# Patient Record
Sex: Male | Born: 1960 | Race: White | Hispanic: No | Marital: Single | Smoking: Never smoker
Health system: Southern US, Community
[De-identification: ages and names within clinical notes are randomized; demographics above are authoritative.]

## PROBLEM LIST (undated history)

## (undated) DIAGNOSIS — E119 Type 2 diabetes mellitus without complications: Secondary | ICD-10-CM

## (undated) HISTORY — DX: Type 2 diabetes mellitus without complications: E11.9

## (undated) HISTORY — PX: APPENDECTOMY: SHX54

---

## 2005-07-22 ENCOUNTER — Emergency Department (HOSPITAL_COMMUNITY): Admission: EM | Admit: 2005-07-22 | Discharge: 2005-07-22 | Payer: Self-pay | Admitting: Emergency Medicine

## 2012-08-13 ENCOUNTER — Ambulatory Visit (INDEPENDENT_AMBULATORY_CARE_PROVIDER_SITE_OTHER): Payer: BC Managed Care – PPO | Admitting: Family Medicine

## 2012-08-13 VITALS — BP 122/80 | HR 99 | Temp 98.1°F | Resp 16 | Ht 68.0 in | Wt 219.0 lb

## 2012-08-13 DIAGNOSIS — C9202 Acute myeloblastic leukemia, in relapse: Secondary | ICD-10-CM

## 2012-08-13 DIAGNOSIS — Z23 Encounter for immunization: Secondary | ICD-10-CM

## 2012-08-13 DIAGNOSIS — IMO0001 Reserved for inherently not codable concepts without codable children: Secondary | ICD-10-CM

## 2012-08-13 LAB — POCT CBC
Granulocyte percent: 70.2 %G (ref 37–80)
HCT, POC: 57 % — AB (ref 43.5–53.7)
MCHC: 33.7 g/dL (ref 31.8–35.4)
MCV: 93 fL (ref 80–97)
MID (cbc): 0.5 (ref 0–0.9)
MPV: 9.6 fL (ref 0–99.8)
POC Granulocyte: 5 (ref 2–6.9)
POC LYMPH PERCENT: 23.4 %L (ref 10–50)
POC MID %: 6.4 %M (ref 0–12)
Platelet Count, POC: 207 10*3/uL (ref 142–424)

## 2012-08-13 LAB — BASIC METABOLIC PANEL
CO2: 29 mEq/L (ref 19–32)
Calcium: 9.4 mg/dL (ref 8.4–10.5)
Creat: 0.81 mg/dL (ref 0.50–1.35)
Sodium: 134 mEq/L — ABNORMAL LOW (ref 135–145)

## 2012-08-13 LAB — GLUCOSE, POCT (MANUAL RESULT ENTRY): POC Glucose: 369 mg/dl — AB (ref 70–99)

## 2012-08-13 MED ORDER — METFORMIN HCL 500 MG PO TABS: ORAL_TABLET | ORAL | Status: DC

## 2012-08-13 NOTE — Patient Instructions (Addendum)
You have "type II" or adult onset diabetes mellitus.  This means that your body does not use up sugar in your blood, causing your blood sugar level to get too high.  Over time this can damage your blood vessels, kidneys, eyes and heart- making it important to treat diabetes carefully.    Please start the metfomin medication- 1 pill twice a day.  Over the course of about 3 weeks try to increase to 2 pills, or 1000 mg, of metformin twice a day.    If you do not already see an eye doctor, please start seeing one yearly  It is important that you have a flu shot every flu season  I will be in touch with you regarding the rest of your labs.  Please come and see [place] in about 2 weeks to check your progress.  Alternatively, you can establish with the primary care doctor of your choice.    Stop drinking regular sodas, and cut down on sugar and bread/ pasta/ rice.  Eat more vegetables and lean protein, and low- fat dairy products.  Look over the american diabetes association website for lots of good information.

## 2012-08-13 NOTE — Progress Notes (Signed)
[facility] [address], [place] [address] [phone]  Date:  08/13/2012   Name:  Zachary Carr   DOB:  [birth year]   MRN:  [id number]  PCP:  No primary provider on file.    Chief Complaint: lab work   History of Present Illness:  Zachary Carr is a 52 y.o. very pleasant male patient who presents with the following:  Here today to evaluate some labs that he had done last month for life insurance.  He does not see a doctor regularly, does not have a PCP and has not had any care in some time. His labs showed an A1c of 14.7, and a FBG of 273.  He has not eaten so far today, and was also fasting at the time of his life insurance blood work.  Suprisingly, his cholesterol was excellent with and LDL in the 50s, and PSA was normal.    He is generally healthy except for this new dx of diabetes.  He is slightly over weight  He works as a Visual merchandiser  There is no problem list on file for this patient.   No past medical history on file.  No past surgical history on file.  History  Substance Use Topics  . Smoking status: Never Smoker   . Smokeless tobacco: Not on file  . Alcohol Use: Not on file    No family history on file.  Not on File  Medication list has been reviewed and updated.  No current outpatient prescriptions on file prior to visit.   No current facility-administered medications on file prior to visit.    Review of Systems:  As per HPI- otherwise negative.  No family history of DM that he is aware of.   Physical Examination: Filed Vitals:   08/13/12 0918  BP: 122/80  Pulse: 99  Temp: 98.1 F (36.7 C)  Resp: 16   Filed Vitals:   08/13/12 0918  Height: 5\' 8"  (1.727 m)  Weight: 219 lb (99.338 kg)   Body mass index is 33.31 kg/(m^2). Ideal Body Weight: Weight in (lb) to have BMI = 25: 164.1  GEN: WDWN, NAD, Non-toxic, A & O x 3 HEENT: Atraumatic, Normocephalic. Neck supple. No masses, No LAD. Ears and Nose: No external deformity. CV:  RRR, No M/G/R. No JVD. No thrill. No extra heart sounds. PULM: CTA B, no wheezes, crackles, rhonchi. No retractions. No resp. distress. No accessory muscle use. ABD: S, NT, ND, +BS. No rebound. No HSM. EXTR: No c/c/e NEURO Normal gait.  PSYCH: Normally interactive. Conversant. Not depressed or anxious appearing.  Calm demeanor.    Results for orders placed in visit on 08/13/12  GLUCOSE, POCT (MANUAL RESULT ENTRY)      Result Value Range   POC Glucose 369 (*) 70 - 99 mg/dl  POCT GLYCOSYLATED HEMOGLOBIN (HGB A1C)      Result Value Range   Hemoglobin A1C >14.0      Assessment and Plan: Type II or unspecified type diabetes mellitus without mention of complication, uncontrolled - Plan: POCT glucose (manual entry), POCT glycosylated hemoglobin (Hb A1C), metFORMIN (GLUCOPHAGE) 500 MG tablet, Pneumococcal polysaccharide vaccine 23-valent greater than or equal to 2yo subcutaneous/IM, Basic metabolic panel, POCT CBC  New dx of DM today.  Will start metfomin 500 BID and plan to build to 1000 mg BID over a few weeks.  Await BMP, and will follow- up with results.  Paper rx for glucose meter and supplies.    See patient  instructions for more details.     Signed Lamar Blinks, MD

## 2012-08-14 ENCOUNTER — Telehealth: Payer: Self-pay | Admitting: Family Medicine

## 2012-08-14 ENCOUNTER — Encounter: Payer: Self-pay | Admitting: Family Medicine

## 2012-08-14 NOTE — Telephone Encounter (Signed)
Called- metabolic profile looks ok, but I am concerned about his elevated HCT/ HG.  May be due to dehydration.  Drink plenty of water and start baby asa daily.  Come and see me in 2 weeks for a recheck.  He agreed

## 2012-08-31 ENCOUNTER — Ambulatory Visit (INDEPENDENT_AMBULATORY_CARE_PROVIDER_SITE_OTHER): Payer: BC Managed Care – PPO | Admitting: Family Medicine

## 2012-08-31 VITALS — BP 124/80 | HR 95 | Temp 99.1°F | Resp 18 | Ht 68.0 in | Wt 218.0 lb

## 2012-08-31 DIAGNOSIS — D45 Polycythemia vera: Secondary | ICD-10-CM

## 2012-08-31 DIAGNOSIS — IMO0001 Reserved for inherently not codable concepts without codable children: Secondary | ICD-10-CM

## 2012-08-31 DIAGNOSIS — D751 Secondary polycythemia: Secondary | ICD-10-CM

## 2012-08-31 LAB — GLUCOSE, POCT (MANUAL RESULT ENTRY): POC Glucose: 200 mg/dl — AB (ref 70–99)

## 2012-08-31 LAB — POCT CBC
Granulocyte percent: 67.4 %G (ref 37–80)
Lymph, poc: 1.4 (ref 0.6–3.4)
MCH, POC: 29.7 pg (ref 27–31.2)
MCV: 92.6 fL (ref 80–97)
MID (cbc): 0.3 (ref 0–0.9)
MPV: 10.1 fL (ref 0–99.8)
POC Granulocyte: 3.5 (ref 2–6.9)
POC LYMPH PERCENT: 26.7 %L (ref 10–50)
Platelet Count, POC: 165 10*3/uL (ref 142–424)
WBC: 5.2 10*3/uL (ref 4.6–10.2)

## 2012-08-31 LAB — COMPLETE METABOLIC PANEL WITH GFR
Albumin: 4.3 g/dL (ref 3.5–5.2)
Alkaline Phosphatase: 70 U/L (ref 39–117)
CO2: 26 mEq/L (ref 19–32)
Glucose, Bld: 194 mg/dL — ABNORMAL HIGH (ref 70–99)
Potassium: 5 mEq/L (ref 3.5–5.3)
Total Protein: 6.4 g/dL (ref 6.0–8.3)

## 2012-08-31 LAB — LDL CHOLESTEROL, DIRECT: Direct LDL: 52 mg/dL

## 2012-08-31 LAB — POCT GLYCOSYLATED HEMOGLOBIN (HGB A1C): Hemoglobin A1C: >=14

## 2012-08-31 MED ORDER — LISINOPRIL 2.5 MG PO TABS: 2.5000 mg | ORAL_TABLET | Freq: Every day | ORAL | Status: DC

## 2012-08-31 NOTE — Patient Instructions (Addendum)
Please come for a lab visit only in 6- 8 weeks to recheck your A1c.  Let me know if your sugars start to go up again.  Our goal for your morning fasting glucose is around 120 or below.

## 2012-08-31 NOTE — Progress Notes (Signed)
[facility] [address], [place] [address] [phone]  Date:  08/31/2012   Name:  Zachary Carr   DOB:  [birth year]   MRN:  [id number]  PCP:  No primary provider on file.    Chief Complaint: Follow-up and Diabetes   History of Present Illness:  Zachary Carr is a 52 y.o. very pleasant male patient who presents with the following:  He was diagnosed with DM on some life insurance screening labs in February of this year.  His A1c at that time was 14.7.  We started on metformin and he has worked up to 2x 500 mg a day.  He is checking his sugar about once a day, and his glucose is now running into the 120s when he checks it in the afternoons before dinner.  He is not checking his sugar in the mornings.  He is taking 1000 mg of metfomin BID right now without a problem  He did eat a bowel of cereal this am.  He is feeling well and is trying to change his diet, but admits he is still eating a fair amount of carbs.   Patient Active Problem List  Diagnosis  . Type II or unspecified type diabetes mellitus without mention of complication, uncontrolled    Past Medical History  Diagnosis Date  . Diabetes mellitus without complication     History reviewed. No pertinent past surgical history.  History  Substance Use Topics  . Smoking status: Never Smoker   . Smokeless tobacco: Not on file  . Alcohol Use: Not on file    Family History  Problem Relation Age of Onset  . Cancer Mother     No Known Allergies  Medication list has been reviewed and updated.  Current Outpatient Prescriptions on File Prior to Visit  Medication Sig Dispense Refill  . metFORMIN (GLUCOPHAGE) 500 MG tablet Start with one pill twice a day and increase to 2 pills twice a day  120 tablet  3   No current facility-administered medications on file prior to visit.    Review of Systems:  As per HPI- otherwise negative.   Physical Examination: Filed Vitals:   08/31/12 0922  BP: 124/80   Pulse: 95  Temp: 99.1 F (37.3 C)  Resp: 18   Filed Vitals:   08/31/12 0922  Height: 5\' 8"  (1.727 m)  Weight: 218 lb (98.884 kg)   Body mass index is 33.15 kg/(m^2). Ideal Body Weight: Weight in (lb) to have BMI = 25: 164.1  GEN: WDWN, NAD, Non-toxic, A & O x 3, overweight HEENT: Atraumatic, Normocephalic. Neck supple. No masses, No LAD. Ears and Nose: No external deformity. CV: RRR, No M/G/R. No JVD. No thrill. No extra heart sounds. PULM: CTA B, no wheezes, crackles, rhonchi. No retractions. No resp. distress. No accessory muscle use. EXTR: No c/c/e NEURO Normal gait.  PSYCH: Normally interactive. Conversant. Not depressed or anxious appearing.  Calm demeanor.   Results for orders placed in visit on 08/31/12  LDL CHOLESTEROL, DIRECT      Result Value Range   Direct LDL 52    COMPLETE METABOLIC PANEL WITH GFR      Result Value Range   Sodium 137  135 - 145 mEq/L   Potassium 5.0  3.5 - 5.3 mEq/L   Chloride 105  96 - 112 mEq/L   CO2 26  19 - 32 mEq/L   Glucose, Bld 194 (*) 70 - 99 mg/dL   BUN 23  6 - 23 mg/dL   Creat 4.78  2.95 - 6.21 mg/dL   Total Bilirubin 0.6  0.3 - 1.2 mg/dL   Alkaline Phosphatase 70  39 - 117 U/L   AST 22  0 - 37 U/L   ALT 25  0 - 53 U/L   Total Protein 6.4  6.0 - 8.3 g/dL   Albumin 4.3  3.5 - 5.2 g/dL   Calcium 9.5  8.4 - 30.8 mg/dL   GFR, Est African American >89     GFR, Est Non African American >89    GLUCOSE, POCT (MANUAL RESULT ENTRY)      Result Value Range   POC Glucose 200 (*) 70 - 99 mg/dl  POCT GLYCOSYLATED HEMOGLOBIN (HGB A1C)      Result Value Range   Hemoglobin A1C >=14.0    POCT CBC      Result Value Range   WBC 5.2  4.6 - 10.2 K/uL   Lymph, poc 1.4  0.6 - 3.4   POC LYMPH PERCENT 26.7  10 - 50 %L   MID (cbc) 0.3  0 - 0.9   POC MID % 5.9  0 - 12 %M   POC Granulocyte 3.5  2 - 6.9   Granulocyte percent 67.4  37 - 80 %G   RBC 5.22  4.69 - 6.13 M/uL   Hemoglobin 15.5  14.1 - 18.1 g/dL   HCT, POC 65.7  84.6 - 53.7 %   MCV  92.6  80 - 97 fL   MCH, POC 29.7  27 - 31.2 pg   MCHC 32.1  31.8 - 35.4 g/dL   RDW, POC 96.2     Platelet Count, POC 165  142 - 424 K/uL   MPV 10.1  0 - 99.8 fL    Assessment and Plan: Type II or unspecified type diabetes mellitus without mention of complication, uncontrolled - Plan: POCT glucose (manual entry), POCT glycosylated hemoglobin (Hb A1C), LDL cholesterol, direct, POCT glycosylated hemoglobin (Hb A1C), CANCELED: Comprehensive metabolic panel, DISCONTINUED: lisinopril (PRINIVIL,ZESTRIL) 2.5 MG tablet  Polycythemia - Plan: POCT CBC  Here to recheck DM today.  Although his A1c is still high, his glucose numbers are trending into the 120s.  Still too early to determine if he will respond adequately to metformin.  Discussed limiting carbs, and plan to recheck A1c in about 6 weeks Await other labs as above, discussed low dose ACE but his BP is very well controlled and he is not known to have proteinuria.   Polycythemia is resolved  See patient instructions for more details.     Signed Abbe Amsterdam, MD

## 2012-09-01 ENCOUNTER — Encounter: Payer: Self-pay | Admitting: Family Medicine

## 2012-09-01 NOTE — Addendum Note (Signed)
Addended by: Abbe Amsterdam C on: 09/01/2012 09:05 AM   Modules accepted: Orders

## 2012-10-06 ENCOUNTER — Encounter: Payer: Self-pay | Admitting: *Deleted

## 2012-11-24 ENCOUNTER — Encounter: Payer: Self-pay | Admitting: Family Medicine

## 2012-11-24 DIAGNOSIS — E113293 Type 2 diabetes mellitus with mild nonproliferative diabetic retinopathy without macular edema, bilateral: Secondary | ICD-10-CM | POA: Insufficient documentation

## 2012-12-11 ENCOUNTER — Other Ambulatory Visit: Payer: Self-pay | Admitting: Family Medicine

## 2013-01-12 ENCOUNTER — Other Ambulatory Visit: Payer: Self-pay | Admitting: Physician Assistant

## 2013-02-13 ENCOUNTER — Other Ambulatory Visit: Payer: Self-pay | Admitting: Family Medicine

## 2013-03-01 ENCOUNTER — Other Ambulatory Visit: Payer: Self-pay | Admitting: Physician Assistant

## 2013-03-02 NOTE — Telephone Encounter (Signed)
Needs OV, labs - 3rd notice 

## 2015-08-25 ENCOUNTER — Other Ambulatory Visit: Payer: Self-pay

## 2015-08-25 ENCOUNTER — Emergency Department
Admission: EM | Admit: 2015-08-25 | Discharge: 2015-08-26 | Disposition: A | Discharge status: Home / Self Care | Payer: BLUE CROSS/BLUE SHIELD | Attending: Emergency Medicine | Admitting: Emergency Medicine

## 2015-08-25 ENCOUNTER — Emergency Department: Payer: BLUE CROSS/BLUE SHIELD

## 2015-08-25 ENCOUNTER — Encounter: Payer: Self-pay | Admitting: Emergency Medicine

## 2015-08-25 DIAGNOSIS — R079 Chest pain, unspecified: Secondary | ICD-10-CM

## 2015-08-25 DIAGNOSIS — R072 Precordial pain: Secondary | ICD-10-CM | POA: Insufficient documentation

## 2015-08-25 DIAGNOSIS — E11319 Type 2 diabetes mellitus with unspecified diabetic retinopathy without macular edema: Secondary | ICD-10-CM | POA: Insufficient documentation

## 2015-08-25 DIAGNOSIS — Z7984 Long term (current) use of oral hypoglycemic drugs: Secondary | ICD-10-CM | POA: Insufficient documentation

## 2015-08-25 LAB — CBC
HCT: 50 % (ref 40.0–52.0)
Hemoglobin: 16.7 g/dL (ref 13.0–18.0)
MCH: 30.5 pg (ref 26.0–34.0)
MCHC: 33.3 g/dL (ref 32.0–36.0)
MCV: 91.6 fL (ref 80.0–100.0)
PLATELETS: 126 10*3/uL — AB (ref 150–440)
RBC: 5.46 MIL/uL (ref 4.40–5.90)
RDW: 13.5 % (ref 11.5–14.5)
WBC: 6.9 10*3/uL (ref 3.8–10.6)

## 2015-08-25 LAB — COMPREHENSIVE METABOLIC PANEL
ALT: 25 U/L (ref 17–63)
ANION GAP: 4 — AB (ref 5–15)
AST: 26 U/L (ref 15–41)
Albumin: 4 g/dL (ref 3.5–5.0)
Alkaline Phosphatase: 61 U/L (ref 38–126)
BUN: 20 mg/dL (ref 6–20)
CHLORIDE: 106 mmol/L (ref 101–111)
CO2: 27 mmol/L (ref 22–32)
Calcium: 8.9 mg/dL (ref 8.9–10.3)
Creatinine, Ser: 0.72 mg/dL (ref 0.61–1.24)
Glucose, Bld: 101 mg/dL — ABNORMAL HIGH (ref 65–99)
POTASSIUM: 4 mmol/L (ref 3.5–5.1)
Sodium: 137 mmol/L (ref 135–145)
Total Bilirubin: 1 mg/dL (ref 0.3–1.2)
Total Protein: 6.9 g/dL (ref 6.5–8.1)

## 2015-08-25 LAB — FIBRIN DERIVATIVES D-DIMER (ARMC ONLY): FIBRIN DERIVATIVES D-DIMER (ARMC): 560 — AB (ref 0–499)

## 2015-08-25 LAB — TROPONIN I: TROPONIN I: 0.04 ng/mL — AB (ref <0.031)

## 2015-08-25 MED ORDER — IOPAMIDOL (ISOVUE-370) INJECTION 76%
100.0000 mL | Freq: Once | INTRAVENOUS | Status: AC | PRN
  Administered 2015-08-25: 100 mL via INTRAVENOUS

## 2015-08-25 MED ORDER — ASPIRIN 81 MG PO CHEW
324.0000 mg | CHEWABLE_TABLET | Freq: Once | ORAL | Status: AC
  Administered 2015-08-25: 324 mg via ORAL
  Filled 2015-08-25: qty 4

## 2015-08-25 NOTE — ED Notes (Signed)
Pt with right flank and right rib pain that increases with right arm movement and twisting movement. Denies cardiac hx.

## 2015-08-25 NOTE — ED Provider Notes (Signed)
[facility] Emergency Department Provider Note  ____________________________________________  Time seen: Approximately 7:56 PM  I have reviewed the triage vital signs and the nursing notes.   HISTORY  Chief Complaint Chest Pain    HPI Zachary Carr is a 55 y.o. male who presents to emergency department for complaint of substernal chest pain radiating to his right side. Patient states that onset was sudden. He denies any cardiac history or familial history of cardiac problems. Patient denies any shortness of breath or diaphoresis. He denies any radiation of his chest pain to back, jaw, or left arm. Patient states that the pain is sharp in nature. He can increase symptoms with deep inspiration. He cannot reproduce symptoms with movement or palpation of his chest. Patient endorses type 2 diabetes but no other comorbidities. Patient does not use tobacco products. Patient is not taking any medications prior to arrival. Patient denies any recent URI symptoms. He denies any activity out of the ordinary. He denies trauma to the chest.   Past Medical History  Diagnosis Date  . Diabetes mellitus without complication [facility])     Patient Active Problem List   Diagnosis Date Noted  . Non-proliferative diabetic retinopathy, mild, both eyes ([place]) 11/24/2012  . Type II or unspecified type diabetes mellitus without mention of complication, uncontrolled 08/13/2012    Past Surgical History  Procedure Laterality Date  . Appendectomy      Current Outpatient Rx  Name  Route  Sig  Dispense  Refill  . aspirin 81 MG tablet   Oral   Take 81 mg by mouth daily.         . metFORMIN (GLUCOPHAGE) 500 MG tablet   Oral   Take 2 tablets (1,000 mg total) by mouth 2 (two) times daily with a meal. NEED VISIT, LABS!!! 3rd notice!   30 tablet   0     Allergies Review of patient's allergies indicates no known allergies.  Family History  Problem Relation Age of Onset  . Cancer  Mother     Social History Social History  Substance Use Topics  . Smoking status: Never Smoker   . Smokeless tobacco: None  . Alcohol Use: None     Review of Systems  Constitutional: No fever/chills Eyes: No visual changes. No discharge ENT: No sore throat.Denies nasal congestion. Cardiovascular: Positive for substernal chest pain. Respiratory: no cough. No SOB. Gastrointestinal: No abdominal pain.  No nausea, no vomiting.  No diarrhea.  No constipation. Genitourinary: Negative for dysuria. No hematuria Musculoskeletal: Negative for back pain. Skin: Negative for rash. Neurological: Negative for headaches, focal weakness or numbness. 10-point ROS otherwise negative.  ____________________________________________   PHYSICAL EXAM:  VITAL SIGNS: ED Triage Vitals  Enc Vitals Group     BP 08/25/15 1856 167/103 mmHg     Pulse Rate 08/25/15 1856 73     Resp --      Temp 08/25/15 1856 98.4 F (36.9 C)     Temp Source 08/25/15 1856 Oral     SpO2 08/25/15 1856 95 %     Weight 08/25/15 1856 242 lb (109.77 kg)     Height 08/25/15 1856 5\' 11"  (1.803 m)     Head Cir --      Peak Flow --      Pain Score 08/25/15 1858 2     Pain Loc --      Pain Edu? --      Excl. in [place]? --      Constitutional: Alert  and oriented. Well appearing and in no acute distress. Eyes: Conjunctivae are normal. PERRL. EOMI. Head: Atraumatic. Neck: No stridor.   Hematological/Lymphatic/Immunilogical: No cervical lymphadenopathy. Cardiovascular: Normal rate, regular rhythm. Normal S1 and S2 with no murmurs, rubs, or gallops.  Good peripheral circulation. No peripheral edema. Respiratory: Normal respiratory effort without tachypnea or retractions. Lungs CTAB. No absent or decreased breath sounds. No rales or rhonchi. Equal chest rise and fall. Gastrointestinal: Bowel sounds 4 quadrants. Soft and nontender to palpation.. No distention. No CVA tenderness. Musculoskeletal: No lower extremity tenderness nor  edema.   Neurologic:  Normal speech and language. No gross focal neurologic deficits are appreciated.  Skin:  Skin is warm, dry and intact. No rash noted. Psychiatric: Mood and affect are normal. Speech and behavior are normal. Patient exhibits appropriate insight and judgement.   ____________________________________________   LABS (all labs ordered are listed, but only abnormal results are displayed)  Labs Reviewed  CBC - Abnormal; Notable for the following:    Platelets 126 (*)    All other components within normal limits  TROPONIN I - Abnormal; Notable for the following:    Troponin I 0.04 (*)    All other components within normal limits  COMPREHENSIVE METABOLIC PANEL - Abnormal; Notable for the following:    Glucose, Bld 101 (*)    Anion gap 4 (*)    All other components within normal limits  FIBRIN DERIVATIVES D-DIMER ([facility] ONLY) - Abnormal; Notable for the following:    Fibrin derivatives D-dimer [facility]) 560 (*)    All other components within normal limits   ____________________________________________  EKG   ____________________________________________  RADIOLOGY Diamantina Providence Cuthriell, personally viewed and evaluated these images (plain radiographs) as part of my medical decision making, as well as reviewing the written report by the radiologist.  Dg Chest 2 View  08/25/2015  CLINICAL DATA:  Pt with right flank and right rib pain that increases with right arm movement and twisting movement. Pain began 2-3 days ago and has been getting worse, Denies cardiac hx. Diabetes. EXAM: CHEST  2 VIEW COMPARISON:  None. FINDINGS: Moderate thoracic spondylosis. Midline trachea. Normal heart size and mediastinal contours. Left hemidiaphragm elevation is mild. Mild lingular volume loss is likely secondary. IMPRESSION: No acute cardiopulmonary disease. Electronically Signed   By: Abigail Miyamoto M.D.   On: 08/25/2015 20:46   Ct Angio Chest Pe W/cm &/or Wo Cm  08/25/2015  CLINICAL DATA:   Right flank and rib pain. Elevated D-dimer. Diabetes. Appendectomy. EXAM: CT ANGIOGRAPHY CHEST WITH CONTRAST TECHNIQUE: Multidetector CT imaging of the chest was performed using the standard protocol during bolus administration of intravenous contrast. Multiplanar CT image reconstructions and MIPs were obtained to evaluate the vascular anatomy. CONTRAST:  100 cc of Isovue 370 COMPARISON:  Chest radiograph of earlier today FINDINGS: Mediastinum/Nodes: The quality of this exam for evaluation of pulmonary embolism is moderate. Minimal motion degradation. Bolus timing suboptimal, with contrast centered in the SVC. No pulmonary embolism to the large segmental level. Mild cardiomegaly. Tortuous thoracic aorta. No mediastinal or hilar adenopathy. Lungs/Pleura: No pleural fluid.  Lingular subsegmental atelectasis. Upper abdomen: Normal imaged portions of the liver, spleen, stomach, pancreas, adrenal glands, left kidney. The right kidney is not visualized and could be positioned more inferiorly or absent. Small gallstones. Musculoskeletal: Moderate thoracic spondylosis. Review of the MIP images confirms the above findings. IMPRESSION: 1. Suboptimal bolus timing. No evidence of pulmonary embolism to the large segmental level. 2. No other explanation for chest pain. 3.  Cholelithiasis. 4. Lack of visualization of the right kidney, which could be absent or more inferiorly positioned. Electronically Signed   By: Abigail Miyamoto M.D.   On: 08/25/2015 22:52    ____________________________________________    PROCEDURES  Procedure(s) performed:       Medications  aspirin chewable tablet 324 mg (324 mg Oral Given 08/25/15 2136)  iopamidol (ISOVUE-370) 76 % injection 100 mL (100 mLs Intravenous Contrast Given 08/25/15 2216)     ____________________________________________   INITIAL IMPRESSION / ASSESSMENT AND PLAN / ED COURSE  Pertinent labs & imaging results that were available during my care of the patient were  reviewed by me and considered in my medical decision making (see chart for details).   ----------------------------------------- 9:58 PM on 08/25/2015 -----------------------------------------  Patient's labs returned with a positive troponin at 0.04 as well as an elevated d-dimer. CT scan of the chest for PE is ordered.    ----------------------------------------- 11:44 PM on 08/25/2015 -----------------------------------------  I personally talked with Dr. Saralyn Pilar, the on-call cardiologist about the patient's exam, lab results, EKG, history. At this time cardiologist recommends outpatient follow-up within 48 hours. Cardiologist advises against second troponin. Cardiologist does not recommend starting patient on any medications at this time. At this time, I have discussed with the patient all results and clinical progress. Patient states that he is asymptomatic at this time. He states that he agrees with treatment plan and that he will follow-up closely with cardiology on Monday morning.   Patient's diagnosis is consistent with unspecified chest pain. Patient's clinical history, physical exam, laboratory findings, radiological results are discussed with Dr. Karma Greaser. At this time it was felt like the patient was stable but cardiology should be consulted. Cardiology was consulted and advised that patient could be discharged with close follow-up in the office within 48 hours. The patient is stable, with improvement of symptoms and at this time it is felt that patient is appropriate to be discharged home with close cardiology follow-up. Patient will follow-up with cardiologist on Monday morning. Cardiologist did not recommend patient being started on any daily medications at this time.. Patient is made aware of all findings and diagnosis and verbalizes understanding and compliance with same. Patient is given strict ED precautions to return to the emergency department for any worsening or return of  symptoms.   ____________________________________________  FINAL CLINICAL IMPRESSION(S) / ED DIAGNOSES  Final diagnoses:  Chest pain, unspecified chest pain type      NEW MEDICATIONS STARTED DURING THIS VISIT:  Discharge Medication List as of 08/25/2015 11:55 PM          This chart was dictated using voice recognition software/Dragon. Despite best efforts to proofread, errors can occur which can change the meaning. Any change was purely unintentional.     Darletta Moll, PA-C 08/26/15 0050  Hinda Kehr, MD 08/26/15 [id number]

## 2015-08-25 NOTE — Discharge Instructions (Signed)
Nonspecific Chest Pain  °Chest pain can be caused by many different conditions. There is always a chance that your pain could be related to something serious, such as a heart attack or a blood clot in your lungs. Chest pain can also be caused by conditions that are not life-threatening. If you have chest pain, it is very important to follow up with your health care provider. °CAUSES  °Chest pain can be caused by: °· Heartburn. °· Pneumonia or bronchitis. °· Anxiety or stress. °· Inflammation around your heart (pericarditis) or lung (pleuritis or pleurisy). °· A blood clot in your lung. °· A collapsed lung (pneumothorax). It can develop suddenly on its own (spontaneous pneumothorax) or from trauma to the chest. °· Shingles infection (varicella-zoster virus). °· Heart attack. °· Damage to the bones, muscles, and cartilage that make up your chest wall. This can include: °· Bruised bones due to injury. °· Strained muscles or cartilage due to frequent or repeated coughing or overwork. °· Fracture to one or more ribs. °· Sore cartilage due to inflammation (costochondritis). °RISK FACTORS  °Risk factors for chest pain may include: °· Activities that increase your risk for trauma or injury to your chest. °· Respiratory infections or conditions that cause frequent coughing. °· Medical conditions or overeating that can cause heartburn. °· Heart disease or family history of heart disease. °· Conditions or health behaviors that increase your risk of developing a blood clot. °· Having had chicken pox (varicella zoster). °SIGNS AND SYMPTOMS °Chest pain can feel like: °· Burning or tingling on the surface of your chest or deep in your chest. °· Crushing, pressure, aching, or squeezing pain. °· Dull or sharp pain that is worse when you move, cough, or take a deep breath. °· Pain that is also felt in your back, neck, shoulder, or arm, or pain that spreads to any of these areas. °Your chest pain may come and go, or it may stay  constant. °DIAGNOSIS °Lab tests or other studies may be needed to find the cause of your pain. Your health care provider may have you take a test called an ambulatory ECG (electrocardiogram). An ECG records your heartbeat patterns at the time the test is performed. You may also have other tests, such as: °· Transthoracic echocardiogram (TTE). During echocardiography, sound waves are used to create a picture of all of the heart structures and to look at how blood flows through your heart. °· Transesophageal echocardiogram (TEE). This is a more advanced imaging test that obtains images from inside your body. It allows your health care provider to see your heart in finer detail. °· Cardiac monitoring. This allows your health care provider to monitor your heart rate and rhythm in real time. °· Holter monitor. This is a portable device that records your heartbeat and can help to diagnose abnormal heartbeats. It allows your health care provider to track your heart activity for several days, if needed. °· Stress tests. These can be done through exercise or by taking medicine that makes your heart beat more quickly. °· Blood tests. °· Imaging tests. °TREATMENT  °Your treatment depends on what is causing your chest pain. Treatment may include: °· Medicines. These may include: °· Acid blockers for heartburn. °· Anti-inflammatory medicine. °· Pain medicine for inflammatory conditions. °· Antibiotic medicine, if an infection is present. °· Medicines to dissolve blood clots. °· Medicines to treat coronary artery disease. °· Supportive care for conditions that do not require medicines. This may include: °· Resting. °· Applying heat   or cold packs to injured areas. °· Limiting activities until pain decreases. °HOME CARE INSTRUCTIONS °· If you were prescribed an antibiotic medicine, finish it all even if you start to feel better. °· Avoid any activities that bring on chest pain. °· Do not use any tobacco products, including  cigarettes, chewing tobacco, or electronic cigarettes. If you need help quitting, ask your health care provider. °· Do not drink alcohol. °· Take medicines only as directed by your health care provider. °· Keep all follow-up visits as directed by your health care provider. This is important. This includes any further testing if your chest pain does not go away. °· If heartburn is the cause for your chest pain, you may be told to keep your head raised (elevated) while sleeping. This reduces the chance that acid will go from your stomach into your esophagus. °· Make lifestyle changes as directed by your health care provider. These may include: °¨ Getting regular exercise. Ask your health care provider to suggest some activities that are safe for you. °¨ Eating a heart-healthy diet. A registered dietitian can help you to learn healthy eating options. °¨ Maintaining a healthy weight. °¨ Managing diabetes, if necessary. °¨ Reducing stress. °SEEK MEDICAL CARE IF: °· Your chest pain does not go away after treatment. °· You have a rash with blisters on your chest. °· You have a fever. °SEEK IMMEDIATE MEDICAL CARE IF:  °· Your chest pain is worse. °· You have an increasing cough, or you cough up blood. °· You have severe abdominal pain. °· You have severe weakness. °· You faint. °· You have chills. °· You have sudden, unexplained chest discomfort. °· You have sudden, unexplained discomfort in your arms, back, neck, or jaw. °· You have shortness of breath at any time. °· You suddenly start to sweat, or your skin gets clammy. °· You feel nauseous or you vomit. °· You suddenly feel light-headed or dizzy. °· Your heart begins to beat quickly, or it feels like it is skipping beats. °These symptoms may represent a serious problem that is an emergency. Do not wait to see if the symptoms will go away. Get medical help right away. Call your local emergency services (911 in the [place] Do not drive yourself to the hospital. °  °This  information is not intended to replace advice given to you by your health care provider. Make sure you discuss any questions you have with your health care provider. °  °Document Released: 02/20/2005 Document Revised: 06/03/2014 Document Reviewed: 12/17/2013 °[employer] Interactive Patient Education ©2016 [employer] ° °Aspirin and Your Heart ° Aspirin is a medicine that affects the way blood clots. Aspirin can be used to help reduce the risk of blood clots, heart attacks, and other heart-related problems.  °SHOULD I TAKE ASPIRIN? °Your health care provider will help you determine whether it is safe and beneficial for you to take aspirin daily. Taking aspirin daily may be beneficial if you: °· Have had a heart attack or chest pain. °· Have undergone open heart surgery such as coronary artery bypass surgery (CABG). °· Have had coronary angioplasty. °· Have experienced a stroke or transient ischemic attack (TIA). °· Have peripheral vascular disease (PVD). °· Have chronic heart rhythm problems such as atrial fibrillation. °ARE THERE ANY RISKS OF TAKING ASPIRIN DAILY? °Daily use of aspirin can increase your risk of side effects. Some of these include: °· Bleeding. Bleeding problems can be minor or serious. An example of a minor problem is a   cut that does not stop bleeding. An example of a more serious problem is stomach bleeding or bleeding into the brain. Your risk of bleeding is increased if you are also taking non-steroidal anti-inflammatory medicine (NSAIDs). °· Increased bruising. °· Upset stomach. °· An allergic reaction. People who have nasal polyps have an increased risk of developing an aspirin allergy. °WHAT ARE SOME GUIDELINES I SHOULD FOLLOW WHEN TAKING ASPIRIN?  °· Take aspirin only as directed by your health care provider. Make sure you understand how much you should take and what form you should take. The two forms of aspirin are: °¨ Non-enteric-coated. This type of aspirin does not have a coating and is  absorbed quickly. Non-enteric-coated aspirin is usually recommended for people with chest pain. This type of aspirin also comes in a chewable form. °¨ Enteric-coated. This type of aspirin has a special coating that releases the medicine very slowly. Enteric-coated aspirin causes less stomach upset than non-enteric-coated aspirin. This type of aspirin should not be chewed or crushed. °· Drink alcohol in moderation. Drinking alcohol increases your risk of bleeding. °WHEN SHOULD I SEEK MEDICAL CARE?  °· You have unusual bleeding or bruising. °· You have stomach pain. °· You have an allergic reaction. Symptoms of an allergic reaction include: °¨ Hives. °¨ Itchy skin. °¨ Swelling of the lips, tongue, or face. °· You have ringing in your ears. °WHEN SHOULD I SEEK IMMEDIATE MEDICAL CARE?  °· Your bowel movements are bloody, dark red, or black in color. °· You vomit or cough up blood. °· You have blood in your urine. °· You cough, wheeze, or feel short of breath. °If you have any of the following symptoms, this is an emergency. Do not wait to see if the pain will go away. Get medical help at once. Call your local emergency services (911 in the [place] Do not drive yourself to the hospital. °· You have severe chest pain, especially if the pain is crushing or pressure-like and spreads to the arms, back, neck, or jaw.  °· You have stroke-like symptoms, such as:   °¨ Loss of vision.   °¨ Difficulty talking.   °¨ Numbness or weakness on one side of your body.   °¨ Numbness or weakness in your arm or leg.   °¨ Not thinking clearly or feeling confused.   °  °This information is not intended to replace advice given to you by your health care provider. Make sure you discuss any questions you have with your health care provider. °  °Document Released: 04/25/2008 Document Revised: 06/03/2014 Document Reviewed: 08/18/2013 °[employer]vier Interactive Patient Education ©2016 [employer] ° °

## 2015-08-26 NOTE — ED Notes (Signed)
Reviewed d/c instructions, follow-up care, and use of aspirin with pt. Pt verbalized understanding

## 2017-04-26 IMAGING — CT CT ANGIO CHEST
2 of 6 series · 19 of 46 positions shown · IV contrast (APPLIED)
Comparison: Chest radiograph of earlier today

CLINICAL DATA: Right flank and rib pain. Elevated D-dimer.
Diabetes. Appendectomy.

EXAM:
CT ANGIOGRAPHY CHEST WITH CONTRAST
TECHNIQUE: Multidetector CT imaging of the chest was performed using the
standard protocol during bolus administration of intravenous
contrast. Multiplanar CT image reconstructions and MIPs were
obtained to evaluate the vascular anatomy.
CONTRAST:  100 cc of Isovue 370

[Series 5: pe thins 1.5 · axial · 0.68mm/px · z∈[+1532,+1779]mm · 16 of 232 slices shown]
[im 13/232  lung]
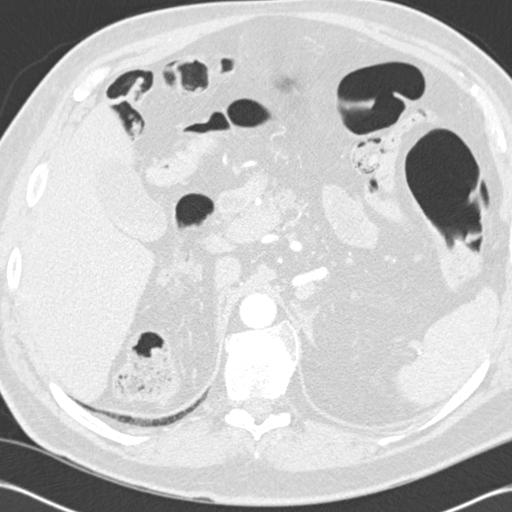
[im 25/232  soft-tissue]
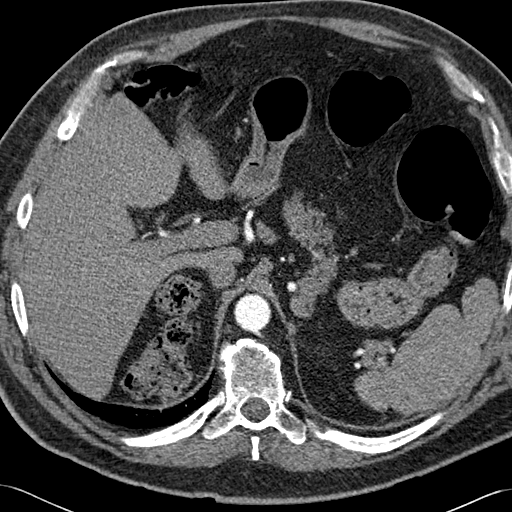
[im 37/232  lung]
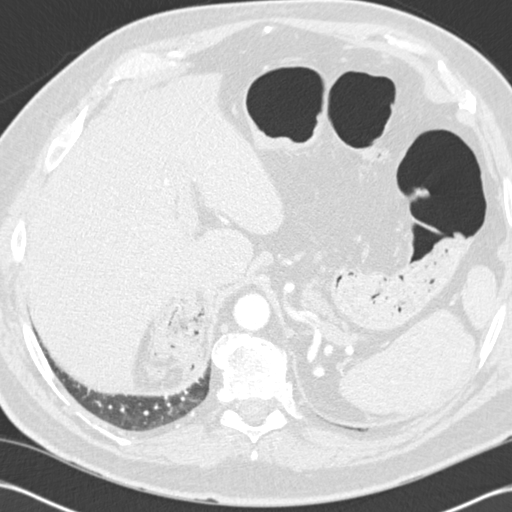
[im 49/232  soft-tissue]
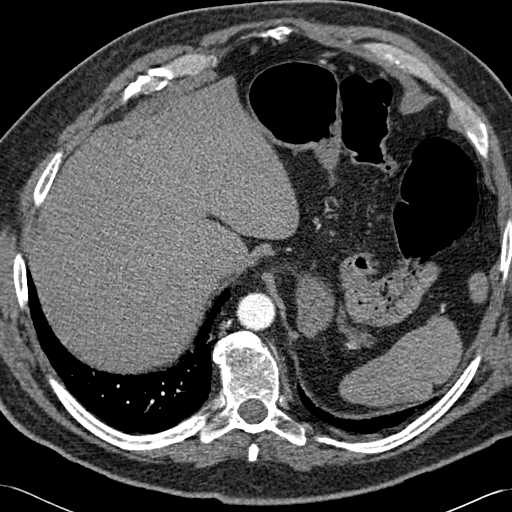
[im 73/232  lung]
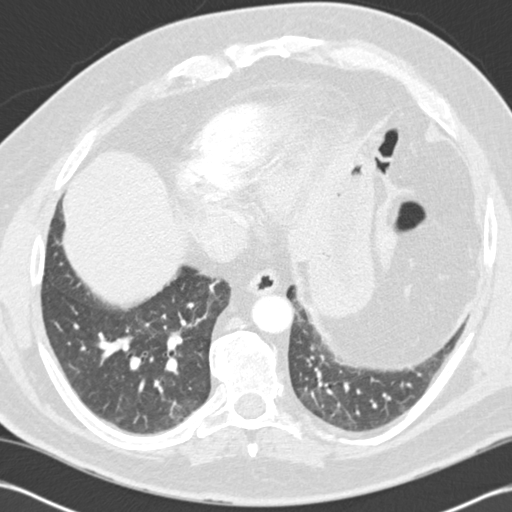
[im 86/232  soft-tissue]
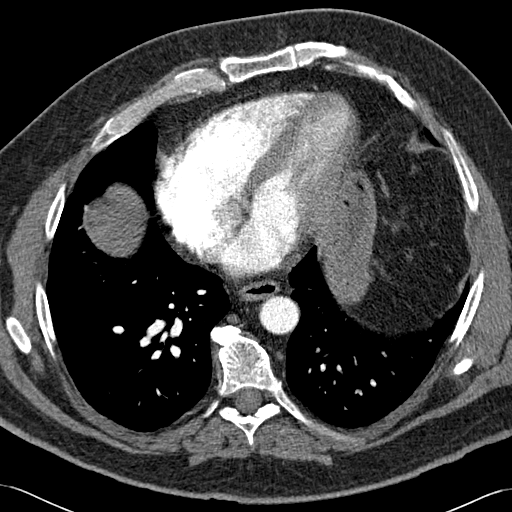
[im 98/232  lung]
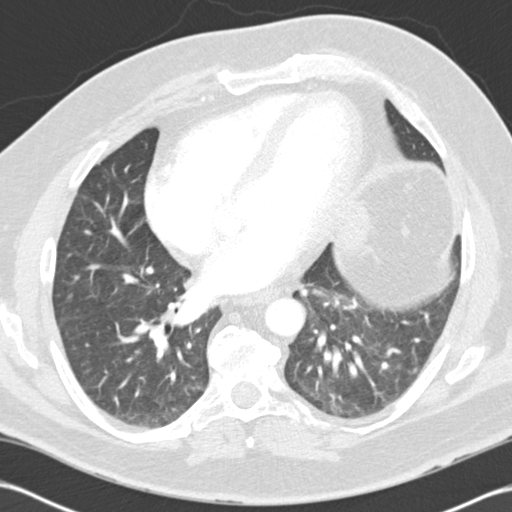
[im 110/232  soft-tissue]
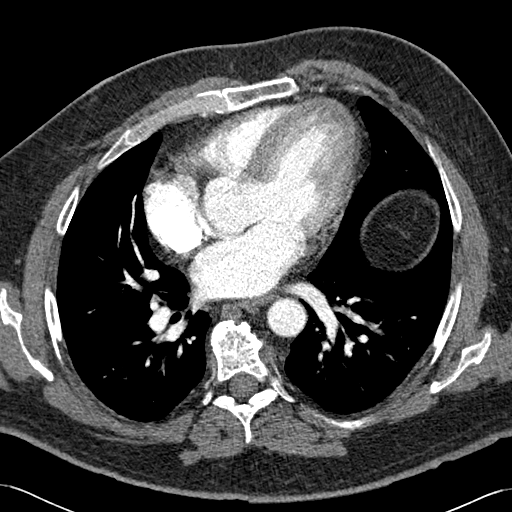
[im 122/232  lung]
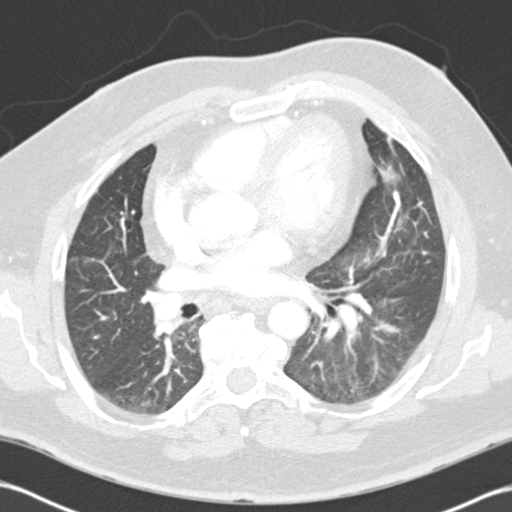
[im 134/232  soft-tissue]
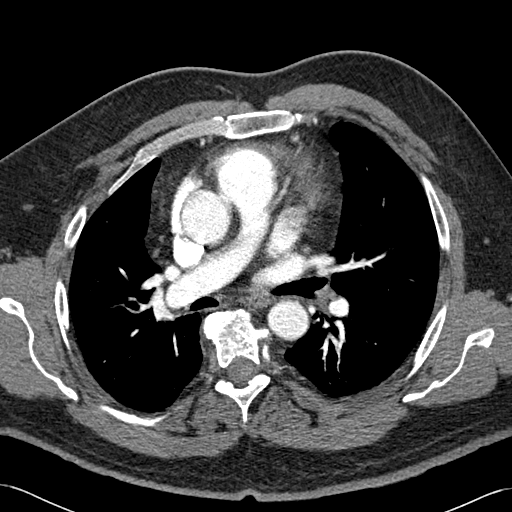
[im 146/232  lung]
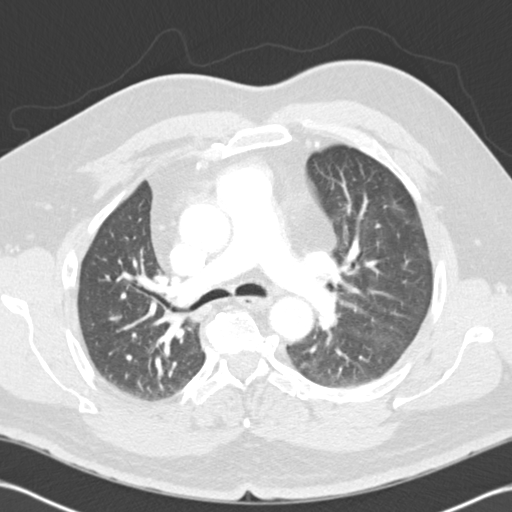
[im 159/232  soft-tissue]
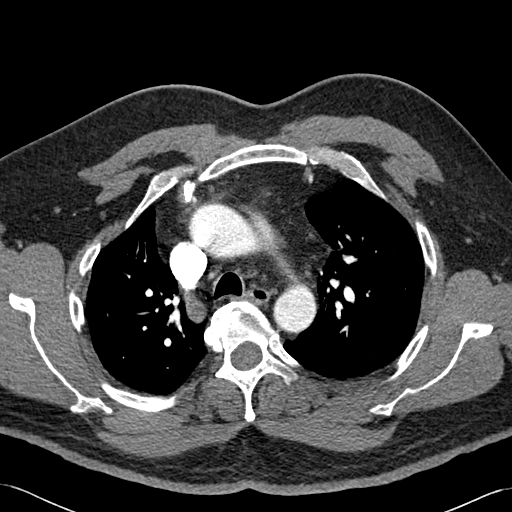
[im 183/232  lung]
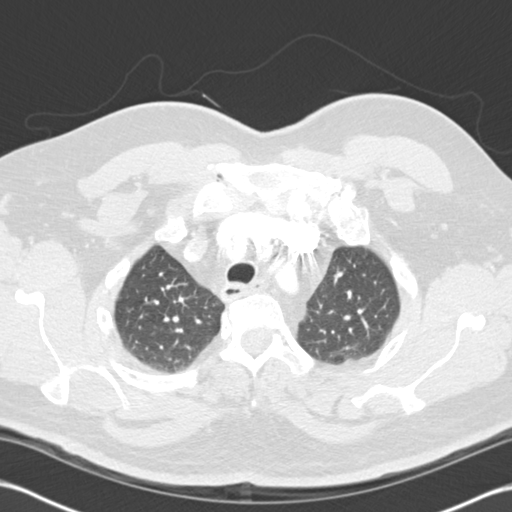
[im 195/232  soft-tissue]
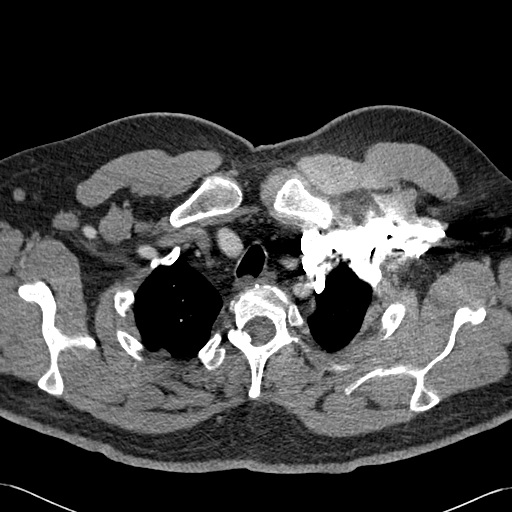
[im 207/232  lung]
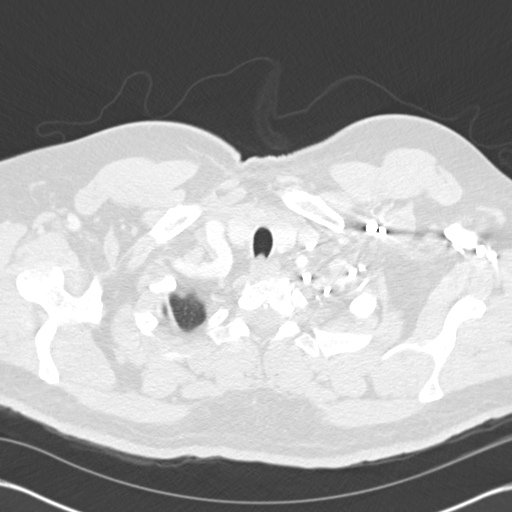
[im 219/232  soft-tissue]
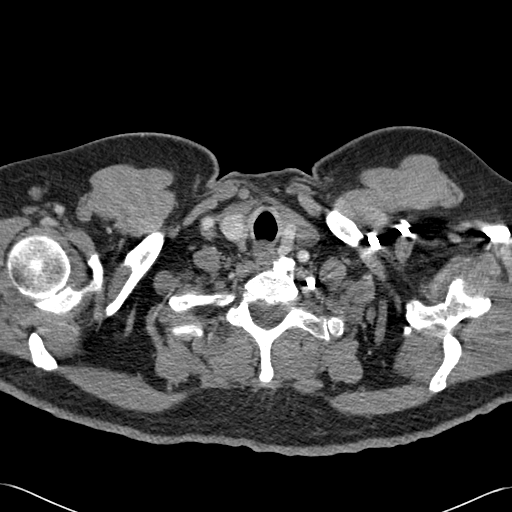

[Series 7: cor mpr 2.0 · coronal · 0.73mm/px · 3 of 152 slices shown]
[im 38/152  soft-tissue]
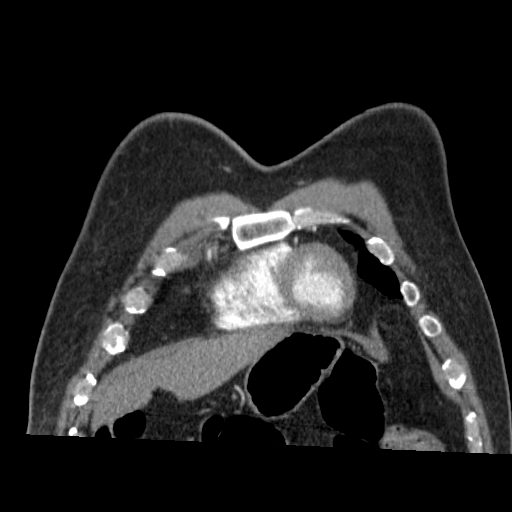
[im 76/152  soft-tissue]
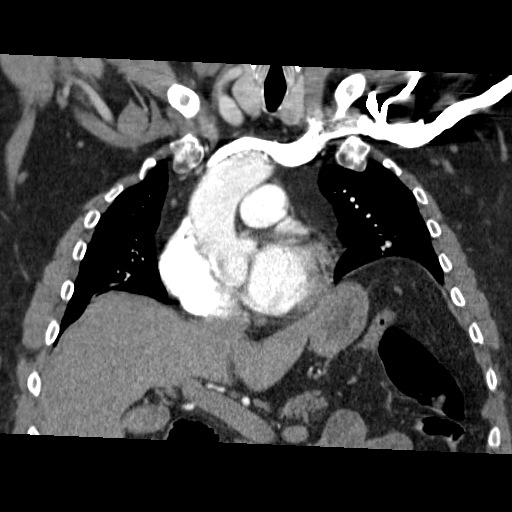
[im 114/152  soft-tissue]
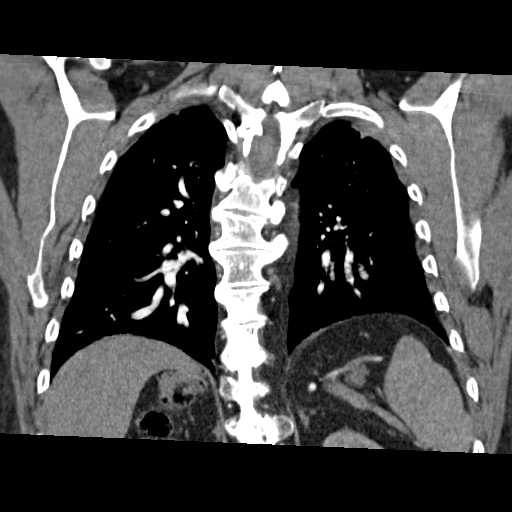

[19 of 46 positions shown; findings below may reference images not displayed]

FINDINGS: Mediastinum/Nodes: The quality of this exam for evaluation of
pulmonary embolism is moderate. Minimal motion degradation. Bolus
timing suboptimal, with contrast centered in the SVC. No pulmonary
embolism to the large segmental level. Mild cardiomegaly. Tortuous
thoracic aorta. No mediastinal or hilar adenopathy.

Lungs/Pleura: No pleural fluid.  Lingular subsegmental atelectasis.

Upper abdomen: Normal imaged portions of the liver, spleen, stomach,
pancreas, adrenal glands, left kidney. The right kidney is not
visualized and could be positioned more inferiorly or absent. Small
gallstones.

Musculoskeletal: Moderate thoracic spondylosis.

Review of the MIP images confirms the above findings.
IMPRESSION: 1. Suboptimal bolus timing. No evidence of pulmonary embolism to the
large segmental level.
2. No other explanation for chest pain.
3. Cholelithiasis.
4. Lack of visualization of the right kidney, which could be absent
or more inferiorly positioned.

## 2023-07-26 DEATH — deceased
